# Patient Record
Sex: Male | Born: 1980 | Hispanic: No | Marital: Single | Smoking: Never smoker
Health system: Southern US, Community
[De-identification: ages and names within clinical notes are randomized; demographics above are authoritative.]

---

## 2000-02-08 ENCOUNTER — Emergency Department (HOSPITAL_COMMUNITY): Admission: EM | Admit: 2000-02-08 | Discharge: 2000-02-08 | Payer: Self-pay | Admitting: Emergency Medicine

## 2000-02-08 ENCOUNTER — Encounter: Payer: Self-pay | Admitting: Emergency Medicine

## 2000-05-22 ENCOUNTER — Emergency Department (HOSPITAL_COMMUNITY): Admission: EM | Admit: 2000-05-22 | Discharge: 2000-05-22 | Payer: Self-pay | Admitting: Emergency Medicine

## 2002-03-23 ENCOUNTER — Emergency Department (HOSPITAL_COMMUNITY): Admission: EM | Admit: 2002-03-23 | Discharge: 2002-03-24 | Payer: Self-pay | Admitting: Emergency Medicine

## 2002-03-24 ENCOUNTER — Emergency Department (HOSPITAL_COMMUNITY): Admission: EM | Admit: 2002-03-24 | Discharge: 2002-03-25 | Payer: Self-pay

## 2017-03-15 ENCOUNTER — Ambulatory Visit (INDEPENDENT_AMBULATORY_CARE_PROVIDER_SITE_OTHER): Payer: Self-pay | Admitting: Emergency Medicine

## 2017-03-15 VITALS — BP 134/74 | HR 88 | Temp 98.3°F | Resp 16 | Ht 68.0 in | Wt 263.4 lb

## 2017-03-15 DIAGNOSIS — F430 Acute stress reaction: Secondary | ICD-10-CM | POA: Insufficient documentation

## 2017-03-15 DIAGNOSIS — F439 Reaction to severe stress, unspecified: Secondary | ICD-10-CM

## 2017-03-15 DIAGNOSIS — H6123 Impacted cerumen, bilateral: Secondary | ICD-10-CM | POA: Insufficient documentation
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
## 2017-03-15 NOTE — Patient Instructions (Addendum)
Compre en la farmacia un kit para remover cerumen del oido (wax removal kit).    IF you received an x-ray today, you will receive an invoice from [facility]. Please contact [facility] at [phone] with questions or concerns regarding your invoice.   IF you received labwork today, you will receive an invoice from [place]. Please contact [facility] at [phone] with questions or concerns regarding your invoice.   Our billing staff will not be able to assist you with questions regarding bills from these companies.  You will be contacted with the lab results as soon as they are available. The fastest way to get your results is to activate your My Chart account. Instructions are located on the last page of this paperwork. If you have not heard from [place] regarding the results in 2 weeks, please contact this office.     Acumulacin de cera en el odo (Earwax Buildup) Los odos producen una sustancia llamada cera. Tambin se puede llamar cerumen. A veces, se acumula demasiada cera en el canal del odo. Esto puede causar dolor de odos y dificultar la audicin. CAUSAS Esta afeccin es causada por demasiada produccin o acumulacin de cera. [place] pueden hacer que usted sea propenso a sufrir esta afeccin:  Limpiarse con frecuencia las orejas con hisopos.  Tener los canales [employer] estrechos.  Tener cera que es demasiado densa o pegajosa.  Tener eccema.  Estar deshidratado. SNTOMAS Los sntomas de esta afeccin incluyen lo siguiente:  Disminucin de la audicin.  Secrecin del odo.  Dolor de odo.  Picazn en el odo.  Una sensacin de que el odo est lleno u obstruido.  Zumbidos en el odo.  Tos. DIAGNSTICO El mdico puede diagnosticar esta afeccin en funcin de los sntomas y la historia clnica. El mdico tambin har un examen del odo y Psychologist, counselling el interior del odo con un dispositivo llamado otoscopio. Tambin  pueden hacerle un estudio de audicin. TRATAMIENTO El tratamiento de esta afeccin incluye lo siguiente:  Gotas ticas de venta libre o recetadas para ablandar la cera.  Extraccin de cera por un mdico. Esto se puede realizar de las siguientes maneras:  Purgar el odo con agua a la temperatura del cuerpo.  Con un instrumento mdico que tiene un rulo en el extremo (cureta para la cera).  Con un dispositivo para aspirar. [place] los medicamentos de venta libre y los recetados solamente como se lo haya indicado el mdico.  No introduzca ningn objeto en su odo, ni siquiera hisopos. La abertura del canal auditivo se puede limpiar con un pao.  Beba suficiente agua para mantener la orina clara o de color amarillo plido.  Si tiene con frecuencia acumulacin de cera en el odo o [place] audfonos, concurra a su mdico cada 6 o 12 meses para una rutina preventiva de limpieza del odo. Concurra a todas las visitas de control como se lo haya indicado el mdico. SOLICITE ATENCIN MDICA SI:  Siente dolor de odos.  La afeccin no mejora con tratamiento.  Tiene prdida de la audicin.  Observa sangre, pus u otro lquido que salen del odo. Esta informacin no tiene Marine scientist el consejo del mdico. Asegrese de hacerle al mdico cualquier pregunta que tenga. Document Released: 10/10/2005 Document Revised: 02/01/2016 Elsevier Interactive Patient Education  2017 [employer].

## 2020-01-17 ENCOUNTER — Emergency Department: Payer: PRIVATE HEALTH INSURANCE

## 2020-01-17 ENCOUNTER — Encounter: Payer: Self-pay | Admitting: Emergency Medicine

## 2020-01-17 ENCOUNTER — Emergency Department
Admission: EM | Admit: 2020-01-17 | Discharge: 2020-01-17 | Disposition: A | Discharge status: Home / Self Care | Payer: PRIVATE HEALTH INSURANCE | Attending: Emergency Medicine | Admitting: Emergency Medicine

## 2020-01-17 ENCOUNTER — Other Ambulatory Visit: Payer: Self-pay

## 2020-01-17 DIAGNOSIS — S92151A Displaced avulsion fracture (chip fracture) of right talus, initial encounter for closed fracture: Secondary | ICD-10-CM | POA: Insufficient documentation

## 2020-01-17 DIAGNOSIS — W11XXXA Fall on and from ladder, initial encounter: Secondary | ICD-10-CM | POA: Diagnosis not present

## 2020-01-17 DIAGNOSIS — Y929 Unspecified place or not applicable: Secondary | ICD-10-CM | POA: Diagnosis not present

## 2020-01-17 DIAGNOSIS — S92011A Displaced fracture of body of right calcaneus, initial encounter for closed fracture: Secondary | ICD-10-CM

## 2020-01-17 DIAGNOSIS — Y939 Activity, unspecified: Secondary | ICD-10-CM | POA: Insufficient documentation

## 2020-01-17 DIAGNOSIS — Y990 Civilian activity done for income or pay: Secondary | ICD-10-CM | POA: Diagnosis not present

## 2020-01-17 DIAGNOSIS — S99921A Unspecified injury of right foot, initial encounter: Secondary | ICD-10-CM | POA: Diagnosis present

## 2020-01-17 MED ORDER — OXYCODONE HCL 5 MG PO TABS: 5.0000 mg | ORAL_TABLET | Freq: Four times a day (QID) | ORAL | 0 refills | Status: AC | PRN

## 2020-01-17 MED ORDER — OXYCODONE-ACETAMINOPHEN 5-325 MG PO TABS
2.0000 | ORAL_TABLET | Freq: Once | ORAL | Status: AC
  Administered 2020-01-17: 2 via ORAL
  Filled 2020-01-17: qty 2

## 2020-01-17 NOTE — ED Triage Notes (Signed)
Pt fell 10 ft off ladder landing on right leg/foot.  C/o pain to foot/ankle/calf.  Cannot bear weight on leg.  Swelling noted.  No obvious deformity.  No neck pain, back pain or head pain. No impact or injury except to foot.

## 2020-01-17 NOTE — ED Provider Notes (Signed)
[facility] EMERGENCY DEPARTMENT Provider Note   CSN: [id number] Arrival date & time: 01/17/20  1336     History Chief Complaint  Patient presents with  . Foot Injury    Jared Barry is a 39 y.o. male presents to the emergency department for evaluation of right foot pain.  He fell off of a ladder at work just prior to arrival, states he was approximately 10 feet high and landed on his right foot.  He has a little soreness in the neck and thoracic spine.  Denies hitting his head, losing consciousness or any nausea or vomiting.  Pain is severe throughout the foot and ankle.  He denies any calf pain, knee pain or hip pain on the right side.  No left-sided discomfort.  No numbness tingling radicular symptoms in the upper or lower extremities.  He does not take blood thinners.  HPI     History reviewed. No pertinent past medical history.  Patient Active Problem List   Diagnosis Date Noted  . Impacted cerumen of both ears 03/15/2017  . Acute stress reaction with predominately emotional disturbance 03/15/2017    History reviewed. No pertinent surgical history.     History reviewed. No pertinent family history.  Social History   Tobacco Use  . Smoking status: Never Smoker  . Smokeless tobacco: Never Used  Substance Use Topics  . Alcohol use: Yes  . Drug use: Never    Home Medications Prior to Admission medications   Medication Sig Start Date End Date Taking? Authorizing Provider  oxyCODONE (ROXICODONE) 5 MG immediate release tablet Take 1 tablet (5 mg total) by mouth every 6 (six) hours as needed. 01/17/20 01/16/21  Evon Slack, PA-C    Allergies    Patient has no known allergies.  Review of Systems   Review of Systems  Constitutional: Negative for fever.  Respiratory: Negative for shortness of breath.   Cardiovascular: Negative for chest pain.  Gastrointestinal: Negative for abdominal pain, nausea and vomiting.  Musculoskeletal:  Positive for arthralgias, gait problem, joint swelling and myalgias.  Skin: Negative for color change, rash and wound.  Neurological: Negative for dizziness, numbness and headaches.    Physical Exam Updated Vital Signs BP (!) 153/77 (BP Location: Right Arm)   Pulse 98   Temp 98.8 F (37.1 C) (Oral)   Resp 17   Ht 6' (1.829 m)   Wt 113.4 kg   SpO2 97%   BMI 33.91 kg/m   Physical Exam Constitutional:      Appearance: He is well-developed.  HENT:     Head: Normocephalic and atraumatic.  Eyes:     Conjunctiva/sclera: Conjunctivae normal.  Cardiovascular:     Rate and Rhythm: Normal rate.  Pulmonary:     Effort: Pulmonary effort is normal. No respiratory distress.     Breath sounds: Normal breath sounds. No wheezing.  Abdominal:     General: Abdomen is flat. Bowel sounds are normal. There is no distension.     Tenderness: There is no abdominal tenderness. There is no guarding.  Musculoskeletal:        General: Normal range of motion.     Cervical back: Normal range of motion and neck supple. No rigidity or tenderness.     Comments: Diffusely tender with swelling along the foot and ankle.  2+ dorsalis pedis pulse present.  Calf is soft and nontender.  Sensation is intact distally.  No skin breakdown noted.  Full range of motion of the cervical  and lumbar spine with no tenderness to palpation.  No tenderness along the spinous process.  No paravertebral muscle tenderness.  Full range of motion of the upper extremities with normal strength.  No pain with logrolling lower extremities.  Knees are nontender to palpation.  Nontender throughout bilateral lower legs from the knee down to just above the ankles  Skin:    General: Skin is warm.     Findings: No rash.  Neurological:     General: No focal deficit present.     Mental Status: He is alert and oriented to person, place, and time. Mental status is at baseline.     Cranial Nerves: No cranial nerve deficit.     Sensory: No sensory  deficit.     Motor: No weakness.     Coordination: Coordination normal.  Psychiatric:        Mood and Affect: Mood normal.        Behavior: Behavior normal.        Thought Content: Thought content normal.        Judgment: Judgment normal.     ED Results / Procedures / Treatments   Labs (all labs ordered are listed, but only abnormal results are displayed) Labs Reviewed - No data to display  EKG None  Radiology DG Cervical Spine 2-3 Views  Result Date: 01/17/2020 CLINICAL DATA:  Status post fall. EXAM: CERVICAL SPINE - 2-3 VIEW COMPARISON:  None. FINDINGS: There is no evidence of cervical spine fracture or prevertebral soft tissue swelling. Alignment is normal. No other significant bone abnormalities are identified. IMPRESSION: Negative cervical spine radiographs. Electronically Signed   By: Aram Candela M.D.   On: 01/17/2020 17:54   DG Thoracic Spine 2 View  Result Date: 01/17/2020 CLINICAL DATA:  Status post fall. EXAM: THORACIC SPINE 2 VIEWS COMPARISON:  None. FINDINGS: There is no evidence of an acute thoracic spine fracture. Alignment is normal. Very mild multilevel endplate sclerosis is seen within the mid and lower thoracic spine. Normal multilevel intervertebral disc space narrowing is seen. IMPRESSION: No evidence of an acute thoracic spine fracture. Electronically Signed   By: Aram Candela M.D.   On: 01/17/2020 17:52   DG Lumbar Spine 2-3 Views  Result Date: 01/17/2020 CLINICAL DATA:  Status post fall. EXAM: LUMBAR SPINE - 2-3 VIEW COMPARISON:  None. FINDINGS: There is no evidence of lumbar spine fracture. Very mild levoscoliosis is seen. Intervertebral disc spaces are maintained. IMPRESSION: No acute fracture or subluxation. Electronically Signed   By: Aram Candela M.D.   On: 01/17/2020 17:54   DG Ankle Complete Right  Result Date: 01/17/2020 CLINICAL DATA:  Fall from ladder.  Right ankle pain. EXAM: RIGHT ANKLE - COMPLETE 3+ VIEW COMPARISON:  No recent.  FINDINGS: Diffuse soft tissue swelling. Tiny bony densities noted adjacent to the lateral aspect of the talus. These could represent tiny avulsion fractures. Comminuted fracture of the calcaneus appears to be present. CT may prove useful for further evaluation. Given the type of injury of thoracolumbar spine series may also prove useful to exclude vertebral body fracture. IMPRESSION: 1. Diffuse soft tissue swelling tiny bony densities noted adjacent to the lateral aspect of the talus. These could represent tiny avulsion fractures. 2. Comminuted fracture of the calcaneus appears to be present. CT may prove useful for further evaluation. Given the injury mechanism a thoracolumbar spine series may also prove useful to exclude vertebral body fracture. Electronically Signed   By: Maisie Fus  Register   On: 01/17/2020 15:36  CT Foot Right Wo Contrast  Result Date: 01/17/2020 CLINICAL DATA:  Calcaneal fracture EXAM: CT OF THE RIGHT FOOT WITHOUT CONTRAST TECHNIQUE: Multidetector CT imaging of the right foot was performed according to the standard protocol. Multiplanar CT image reconstructions were also generated. COMPARISON:  Same-day x-ray FINDINGS: Bones/Joint/Cartilage Acute heavily comminuted fracture of the calcaneal body with intra-articular extension to the subtalar joints. Nondisplaced fracture component at the anterior process which extends into the calcaneocuboid joint. Subtalar and calcaneocuboid joint alignment is maintained without dislocation. Numerous small comminuted fracture fragments within the sinus tarsi. Tibiotalar joint alignment is maintained without dislocation. Acute minimally displaced avulsion fractures from the lateral cortex of the talus (series 5, image 65). Probable nondisplaced fracture along the inferior tip of the lateral malleolus (series 7, image 219). Talar dome appears intact. Distal tibia is intact. Bones of the midfoot are intact without fracture or malalignment. Tarsometatarsal  joint alignment is anatomic. The MTP joints and phalanges of the right foot are intact. Ligaments Suboptimally assessed by CT. Muscles and Tendons Peroneal tendons are laterally dislocated. Posteromedial calcaneal fracture component closely approximates the traversing flexor hallucis longus tendon (series 6, image 82). Achilles tendon grossly intact. Anterior ankle tendons grossly intact. Unremarkable muscle bulk. Soft tissues Marked soft tissue swelling at the hindfoot and lateral ankle. Focal soft tissue hematoma at the medial aspect of the hindfoot measuring approximately 3.6 x 1.5 x 2.8 cm (series 6, image 89). Additional smaller more ill-defined hematoma lateral to the lateral malleolus. IMPRESSION: 1. Acute heavily comminuted fracture of the calcaneal body with intra-articular extension to the subtalar and calcaneocuboid joints. 2. Acute minimally displaced avulsion fractures from the lateral cortex of the talus. 3. Probable nondisplaced fracture along the inferior tip of the lateral malleolus. 4. Peroneal tendons are laterally dislocated. 5. Posteromedial calcaneal fracture component closely approximates the traversing flexor hallucis longus tendon. 6. Marked soft tissue swelling including a focal soft tissue hematoma at the medial aspect of the hindfoot measuring up to 3.6 cm. Electronically Signed   By: Davina Poke D.O.   On: 01/17/2020 17:06    Procedures .Ortho Injury Treatment  Date/Time: 01/17/2020 6:29 PM Performed by: Duanne Guess, PA-C Authorized by: Duanne Guess, PA-C   Consent:    Consent obtained:  Verbal   Consent given by:  Patient   Alternatives discussed:  No treatmentInjury location: foot Location details: right foot Injury type: fracture Fracture type: talar and calcaneal Pre-procedure neurovascular assessment: neurovascularly intact Pre-procedure distal perfusion: normal Pre-procedure neurological function: normal Pre-procedure range of motion:  reduced  Anesthesia: Local anesthesia used: no  Patient sedated: NoImmobilization: splint Supplies used: cotton padding,  elastic bandage and Ortho-Glass Post-procedure neurovascular assessment: post-procedure neurovascularly intact Post-procedure distal perfusion: normal Post-procedure neurological function: normal Post-procedure range of motion: unchanged    (including critical care time)  Medications Ordered in ED Medications  oxyCODONE-acetaminophen (PERCOCET/ROXICET) 5-325 MG per tablet 2 tablet (2 tablets Oral Given 01/17/20 1821)    ED Course  I have reviewed the triage vital signs and the nursing notes.  Pertinent labs & imaging results that were available during my care of the patient were reviewed by me and considered in my medical decision making (see chart for details).    MDM Rules/Calculators/A&P                      39 year old male with multiple fractures to the right foot and ankle.  Most severely comminuted calcaneal fracture.  Discussed with podiatry, place patient into  splint.  He is nonweightbearing given crutches.  He is given prescription for oxycodone for pain.  Will take over-the-counter Tylenol and ibuprofen as needed.  Will call podiatrist Monday morning to schedule follow-up appointment. Final Clinical Impression(s) / ED Diagnoses Final diagnoses:  Closed displaced fracture of body of right calcaneus, initial encounter  Closed displaced avulsion fracture of right talus, initial encounter    Rx / DC Orders ED Discharge Orders         Ordered    oxyCODONE (ROXICODONE) 5 MG immediate release tablet  Every 6 hours PRN     01/17/20 1822           Ronnette Juniper 01/17/20 1832    Emily Filbert, MD 01/18/20 1553

## 2020-01-17 NOTE — Discharge Instructions (Signed)
Please rest and elevate the right upper extremity.  Do not apply any weight on the right lower extremity.  Use crutches as needed for walking.  Take oxycodone as needed for pain.  Call podiatry office Monday to schedule follow-up appointment.

## 2021-05-26 ENCOUNTER — Other Ambulatory Visit: Payer: Self-pay

## 2021-05-26 ENCOUNTER — Emergency Department (HOSPITAL_COMMUNITY): Payer: Self-pay

## 2021-05-26 ENCOUNTER — Emergency Department (HOSPITAL_COMMUNITY)
Admission: EM | Admit: 2021-05-26 | Discharge: 2021-05-26 | Disposition: A | Discharge status: Home / Self Care | Payer: Self-pay | Attending: Emergency Medicine | Admitting: Emergency Medicine

## 2021-05-26 DIAGNOSIS — M79602 Pain in left arm: Secondary | ICD-10-CM | POA: Insufficient documentation

## 2021-05-26 DIAGNOSIS — R7401 Elevation of levels of liver transaminase levels: Secondary | ICD-10-CM

## 2021-05-26 DIAGNOSIS — I10 Essential (primary) hypertension: Secondary | ICD-10-CM

## 2021-05-26 DIAGNOSIS — M79609 Pain in unspecified limb: Secondary | ICD-10-CM

## 2021-05-26 DIAGNOSIS — R202 Paresthesia of skin: Secondary | ICD-10-CM | POA: Insufficient documentation

## 2021-05-26 LAB — COMPREHENSIVE METABOLIC PANEL
ALT: 59 U/L — ABNORMAL HIGH (ref 0–44)
AST: 31 U/L (ref 15–41)
Albumin: 4.4 g/dL (ref 3.5–5.0)
Alkaline Phosphatase: 57 U/L (ref 38–126)
Anion gap: 11 (ref 5–15)
BUN: 11 mg/dL (ref 6–20)
CO2: 23 mmol/L (ref 22–32)
Calcium: 9.5 mg/dL (ref 8.9–10.3)
Chloride: 103 mmol/L (ref 98–111)
Creatinine, Ser: 0.78 mg/dL (ref 0.61–1.24)
GFR, Estimated: >60 mL/min (ref >60)
Glucose, Bld: 136 mg/dL — ABNORMAL HIGH (ref 70–99)
Potassium: 3.7 mmol/L (ref 3.5–5.1)
Sodium: 137 mmol/L (ref 135–145)
Total Bilirubin: 1 mg/dL (ref 0.3–1.2)
Total Protein: 7.7 g/dL (ref 6.5–8.1)

## 2021-05-26 LAB — URINALYSIS, ROUTINE W REFLEX MICROSCOPIC
Bilirubin Urine: NEGATIVE
Glucose, UA: NEGATIVE mg/dL
Hgb urine dipstick: NEGATIVE
Ketones, ur: NEGATIVE mg/dL
Leukocytes,Ua: NEGATIVE
Nitrite: NEGATIVE
Protein, ur: NEGATIVE mg/dL
Specific Gravity, Urine: 1.001 — ABNORMAL LOW (ref 1.005–1.030)
pH: 6 (ref 5.0–8.0)

## 2021-05-26 LAB — CBC WITH DIFFERENTIAL/PLATELET
Abs Immature Granulocytes: 0.02 10*3/uL (ref 0.00–0.07)
Basophils Absolute: 0 10*3/uL (ref 0.0–0.1)
Basophils Relative: 1 %
Eosinophils Absolute: 0.2 10*3/uL (ref 0.0–0.5)
Eosinophils Relative: 3 %
HCT: 42.1 % (ref 39.0–52.0)
Hemoglobin: 14.5 g/dL (ref 13.0–17.0)
Immature Granulocytes: 0 %
Lymphocytes Relative: 38 %
Lymphs Abs: 2.4 10*3/uL (ref 0.7–4.0)
MCH: 31.9 pg (ref 26.0–34.0)
MCHC: 34.4 g/dL (ref 30.0–36.0)
MCV: 92.7 fL (ref 80.0–100.0)
Monocytes Absolute: 0.4 10*3/uL (ref 0.1–1.0)
Monocytes Relative: 7 %
Neutro Abs: 3.2 10*3/uL (ref 1.7–7.7)
Neutrophils Relative %: 51 %
Platelets: 307 10*3/uL (ref 150–400)
RBC: 4.54 MIL/uL (ref 4.22–5.81)
RDW: 12.2 % (ref 11.5–15.5)
WBC: 6.3 10*3/uL (ref 4.0–10.5)
nRBC: 0 % (ref 0.0–0.2)

## 2021-05-26 LAB — TROPONIN I (HIGH SENSITIVITY)
Troponin I (High Sensitivity): 5 ng/L (ref <18)
Troponin I (High Sensitivity): 4 ng/L (ref <18)

## 2021-05-26 LAB — BETA-HYDROXYBUTYRIC ACID: Beta-Hydroxybutyric Acid: 0.36 mmol/L — ABNORMAL HIGH (ref 0.05–0.27)

## 2021-05-26 NOTE — ED Triage Notes (Signed)
Pt states having left sided arm numbness, mouth is dry, and feeling malaise since Monday. Was seen at a clinic and given lisinopril and metformin yesterday. Symptoms returned today around midnight. Denies Headache. No other neuro symptoms.

## 2021-05-26 NOTE — ED Provider Notes (Signed)
[facility] EMERGENCY DEPARTMENT Provider Note   CSN: [id number] Arrival date & time: 05/26/21  0235     History Chief Complaint  Patient presents with   Hypertension   Numbness    Right Arm     Jared Barry is a 40 y.o. male.  The history is provided by the patient. A language interpreter was used.  Hypertension He was seen at a clinic yesterday and told that his blood sugar was high and his blood pressure was high.  He was given prescriptions for metformin and lisinopril and has taken the first dose of each.  Tonight, he noted numbness in his left arm.  He denies any facial numbness or leg numbness.  He denies any weakness.  He is concerned that his blood pressure may have gone up and came in for further evaluation.  He denies chest pain, heaviness, tightness, pressure.  He denies headache, tinnitus, nosebleed.  He denies dyspnea.  He denies any weakness.   No past medical history on file.  Patient Active Problem List   Diagnosis Date Noted   Impacted cerumen of both ears 03/15/2017   Acute stress reaction with predominately emotional disturbance 03/15/2017    No past surgical history on file.     No family history on file.  Social History   Tobacco Use   Smoking status: Never   Smokeless tobacco: Never  Substance Use Topics   Alcohol use: Yes   Drug use: Never    Home Medications Prior to Admission medications   Not on File    Allergies    Patient has no known allergies.  Review of Systems   Review of Systems  All other systems reviewed and are negative.  Physical Exam Updated Vital Signs BP (!) 157/98 (BP Location: Right Arm)   Pulse 78   Temp 98.2 F (36.8 C) (Oral)   Resp 20   SpO2 99%   Physical Exam Vitals and nursing note reviewed.  40 year old male, resting comfortably and in no acute distress. Vital signs are significant for elevated blood pressure. Oxygen saturation is 99%, which is normal. Head is normocephalic  and atraumatic. PERRLA, EOMI. Oropharynx is clear. Neck is nontender and supple without adenopathy or JVD.  There are no carotid bruits. Back is nontender and there is no CVA tenderness. Lungs are clear without rales, wheezes, or rhonchi. Chest is nontender. Heart has regular rate and rhythm without murmur. Abdomen is soft, flat, nontender without masses or hepatosplenomegaly and peristalsis is normoactive. Extremities have no cyanosis or edema, full range of motion is present. Skin is warm and dry without rash. Neurologic: Mental status is normal, cranial nerves are intact.  Strength is 5/5 in all 4 extremities.  There is no pronator drift sensation is equal throughout.  There is no extinction on double simultaneous stimulation.  Station and gait are normal.  ED Results / Procedures / Treatments   Labs (all labs ordered are listed, but only abnormal results are displayed) Labs Reviewed  COMPREHENSIVE METABOLIC PANEL - Abnormal; Notable for the following components:      Result Value   Glucose, Bld 136 (*)    ALT 59 (*)    All other components within normal limits  BETA-HYDROXYBUTYRIC ACID - Abnormal; Notable for the following components:   Beta-Hydroxybutyric Acid 0.36 (*)    All other components within normal limits  URINALYSIS, ROUTINE W REFLEX MICROSCOPIC - Abnormal; Notable for the following components:   Color, Urine COLORLESS (*)  Specific Gravity, Urine 1.001 (*)    All other components within normal limits  CBC WITH DIFFERENTIAL/PLATELET  CBG MONITORING, ED  TROPONIN I (HIGH SENSITIVITY)  TROPONIN I (HIGH SENSITIVITY)    EKG EKG Interpretation  Date/Time:  Wednesday May 26 2021 02:59:05 EDT Ventricular Rate:  75 PR Interval:  144 QRS Duration: 90 QT Interval:  386 QTC Calculation: 431 R Axis:   84 Text Interpretation: Normal sinus rhythm Possible Inferior infarct , age undetermined Abnormal ECG No old tracing to compare Confirmed by Dione Booze ([id number]) on  05/26/2021 5:32:28 AM  Radiology DG Chest 2 View  Result Date: 05/26/2021 CLINICAL DATA:  Hypertension, chest pain, left arm numbness EXAM: CHEST - 2 VIEW COMPARISON:  None. FINDINGS: The heart size and mediastinal contours are within normal limits. Both lungs are clear. The visualized skeletal structures are unremarkable. IMPRESSION: No active cardiopulmonary disease. Electronically Signed   By: Helyn Numbers MD   On: 05/26/2021 03:28   CT HEAD WO CONTRAST ( )  Result Date: 05/26/2021 CLINICAL DATA:  Left arm numbness EXAM: CT HEAD WITHOUT CONTRAST TECHNIQUE: Contiguous axial images were obtained from the base of the skull through the vertex without intravenous contrast. COMPARISON:  None. FINDINGS: Brain: There is no mass, hemorrhage or extra-axial collection. Asymmetry of the lateral ventricles with slightly enlarged right side. This may indicate the presence of an intraventricular arachnoid cyst. The brain parenchyma is normal, without acute or chronic infarction. 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 Vascular: No abnormal hyperdensity of the major intracranial arteries or dural venous sinuses. No intracranial atherosclerosis. Skull: The visualized skull base, calvarium and extracranial soft tissues are normal. Sinuses/Orbits: No fluid levels or advanced mucosal thickening of the visualized paranasal sinuses. No mastoid or middle ear effusion. The orbits are normal. Electronically Signed   By: Deatra Robinson M.D.   On: 05/26/2021 04:10    Procedures Procedures   Medications Ordered in ED Medications - No data to display  ED Course  I have reviewed the triage vital signs and the nursing notes.  Pertinent labs & imaging results that were available during my care of the patient were reviewed by me and considered in my medical decision making (see chart for details).    MDM Rules/Calculators/A&P                         Elevated blood pressure with recent diagnosis of hypertension.   Paresthesias without any objective sensory loss.  No evidence of stroke.  From triage, he was sent for CT of head which was showed no acute findings.  Chest x-ray was normal and ECG showed inferior wall Q waves with which could be consistent with an old inferior wall myocardial infarction.  Labs show mildly elevated glucose of 136, and mild elevation of ALT which is not felt to be clinically significant.  Old records are reviewed, and he has no relevant past visits.  Patient was reassured that there is no sign of stroke today.  He is encouraged to continue taking his metformin and lisinopril as prescribed.  He is encouraged to monitor his blood pressure at home.  He has a follow-up appointment in this clinic later today needs to keep that appointment.  Final Clinical Impression(s) / ED Diagnoses Final diagnoses:  Paresthesia and pain of left extremity  Elevated blood pressure reading with diagnosis of hypertension  Elevated ALT measurement    Rx / DC Orders ED Discharge Orders  None        Dione Booze, MD 05/26/21 [phone]

## 2021-05-26 NOTE — Discharge Instructions (Addendum)
Contine tomando metformina y lisinopril.  Consiga una mquina para controlar su presin arterial en casa. Lleve un registro de ello y llvelo consigo cuando vaya a la clnica.

## 2021-05-26 NOTE — ED Provider Notes (Signed)
Emergency Medicine Provider Triage Evaluation Note  Jared Barry , a 40 y.o. male  was evaluated in triage.  Pt complains of HTN.  Pt reports yesterday he went to a urgent care due to left sided arm numbness that started on Monday.  Pt reports he was found to have HTN and was given metformin and lisinopril, but today the blood pressure came back up. He reports has not been checking this at home. He reports tonight he became concerned because the arm numbness returned around midnight. He was also concerned about his blood sugar.  Pt reports his mouth and eyes feel dry and that he is urinating often.  Review of Systems  Positive: HTN, left arm paresthesias Negative: Headache, neck pain  Physical Exam  BP (!) 157/98 (BP Location: Right Arm)   Pulse 78   Temp 98.2 F (36.8 C) (Oral)   Resp 20   SpO2 99%  Gen:   Awake, no distress   Resp:  Normal effort  MSK:   Moves extremities without difficulty  Other:  Strength 5/5 in the BUE and BLE, normal gait, dry mucous membranes   Medical Decision Making  Medically screening exam initiated at 2:50 AM.  Appropriate orders placed.  Jared Barry was informed that the remainder of the evaluation will be completed by another provider, this initial triage assessment does not replace that evaluation, and the importance of remaining in the ED until their evaluation is complete.  Suspect hypertensive urgency +/- hyperglycemia. Work-up pending.    Jared Barry, Jared Barry 05/26/21 0258    Jared Booze, MD 05/26/21 Jared Barry

## 2022-02-08 ENCOUNTER — Ambulatory Visit
Admission: RE | Admit: 2022-02-08 | Discharge: 2022-02-08 | Disposition: A | Discharge status: Home / Self Care | Payer: Self-pay | Source: Ambulatory Visit | Attending: Emergency Medicine | Admitting: Emergency Medicine

## 2022-02-08 VITALS — BP 124/80 | HR 86 | Temp 98.0°F | Resp 20

## 2022-02-08 DIAGNOSIS — K649 Unspecified hemorrhoids: Secondary | ICD-10-CM | POA: Insufficient documentation

## 2022-02-08 DIAGNOSIS — J038 Acute tonsillitis due to other specified organisms: Secondary | ICD-10-CM | POA: Insufficient documentation

## 2022-02-08 DIAGNOSIS — B9689 Other specified bacterial agents as the cause of diseases classified elsewhere: Secondary | ICD-10-CM | POA: Insufficient documentation

## 2022-02-08 DIAGNOSIS — J020 Streptococcal pharyngitis: Secondary | ICD-10-CM | POA: Insufficient documentation

## 2022-02-08 MED ORDER — HYDROCORTISONE (PERIANAL) 2.5 % EX CREA: 1.0000 "application " | TOPICAL_CREAM | Freq: Two times a day (BID) | CUTANEOUS | 1 refills | Status: AC

## 2022-02-08 MED ORDER — CEFDINIR 300 MG PO CAPS: 300.0000 mg | ORAL_CAPSULE | Freq: Two times a day (BID) | ORAL | 0 refills | Status: AC

## 2022-02-08 NOTE — ED Triage Notes (Signed)
Pt presents with possible abscess or hemorrhoid around anus , discovered yesterday, also has c/o sore throat   ?

## 2022-02-08 NOTE — Discharge Instructions (Addendum)
Su prueba r?pida de estreptococo de hoy es negativa; sin embargo, seg?n los [employer] del examen f?sico, creo que tiene una infecci?n bacteriana que no es [employer]. He enviado una receta de antibi?ticos para tratar la infecci?n a su farmacia. Tome 1 c?psula dos veces al d?a durante los pr?ximos 10 d?as. Tome todas las c?psulas seg?n lo prescrito, no se salte ninguna dosis. El tratamiento incompleto puede provocar un empeoramiento de la infecci?n que puede requerir hospitalizaci?n. Reemplace su cepillo de dientes con un cepillo de dientes nuevo para evitar una reinfecci?n. ? ?Lea la informaci?n adjunta sobre las hemorroides. ? ?Por favor, comience la crema de proctozona. Aplique la crema en el ?rea afectada dos veces al d?a y nuevamente cada vez que defeque. Si el ?rea no ha mejorado despu?s de 2 semanas de tratamiento, es posible que deba ver a un cirujano general para que le extraiga la hemorroide. ? ?Envi? una solicitud a nuestro departamento de referencias para ayudarlo a Clinical research associate un proveedor de atenci?n primaria. Las hemorroides se consideran un problema de atenci?n primaria porque no ponen en peligro la vida y no requieren tratamiento urgente. Cuando tenga inquietudes que no sean urgentes, aseg?rese de comunicarse con su proveedor de atenci?n primaria. ? ?Gracias por visitar la atenci?n de urgencia hoy. ? ? ? ?Your rapid strep test today is negative however based on physical exam findings I believe that you have a bacterial infection that is not strep.  I have sent a prescription for antibiotics to treat the infection to your pharmacy.  Please take 1 capsule twice daily for the next 10 days.   Please take all capsules as prescribed, do not skip doses.  Incomplete treatment can result in worsening infection that may require hospitalization.  Please replace your toothbrush with a brand-new toothbrush to prevent reinfection.  ? ?Please read the enclosed information about hemorrhoids. ? ?Please begin  proctozone cream.  Please apply the cream to the affected area twice daily and again every time you move your bowels.  If the area has not improved after 2 weeks of treatment, you may need to see a general surgeon to have the hemorrhoid removed. ? ?I have sent a request to our referral department to help you find a primary care provider.  Hemorrhoids are considered a primary care issue because they are not life-threatening and do not require urgent treatment.  When you have concerns that are not urgent, please be sure you reach out to your primary care provider. ? ?Thank you for visiting urgent care today. ?

## 2022-02-08 NOTE — ED Provider Notes (Signed)
?[facility] ? ? ? ?CSN: [id number] ?Arrival date & time: 02/08/22  1230 ?  ? ?HISTORY  ? ?Chief Complaint  ?Patient presents with  ? Wound Check  ?  Not necessarily a wound, but options for visits are limited. I am having pain in my anal area, like inflamation of some sort, need help  please - Entered by patient  ? ?HPI ?Jared Barry is a 41 y.o. male. Patient complains of a possible abscess or hemorrhoid around his anus, states he noticed it yesterday.  Patient has not tried any medication to relieve his symptoms.  Patient also complains of a sore throat.  Patient has normal vital signs on arrival today.  Has also not tried any medications to relieve sore throat. ? ?The history is provided by the patient.  ?History reviewed. No pertinent past medical history. ?Patient Active Problem List  ? Diagnosis Date Noted  ? Impacted cerumen of both ears 03/15/2017  ? Acute stress reaction with predominately emotional disturbance 03/15/2017  ? ?History reviewed. No pertinent surgical history. ? ?Home Medications   ? ?Prior to Admission medications   ?Not on File  ? ? ?Family History ?History reviewed. No pertinent family history. ?Social History ?Social History  ? ?Tobacco Use  ? Smoking status: Never  ? Smokeless tobacco: Never  ?Substance Use Topics  ? Alcohol use: Yes  ? Drug use: Never  ? ?Allergies   ?Patient has no known allergies. ? ?Review of Systems ?Review of Systems ?Pertinent findings noted in history of present illness.  ? ?Physical Exam ?Triage Vital Signs ?ED Triage Vitals  ?Enc Vitals Group  ?   BP 08/20/21 0827 (!) 147/82  ?   Pulse Rate 08/20/21 0827 72  ?   Resp 08/20/21 0827 18  ?   Temp 08/20/21 0827 98.3 ?F (36.8 ?C)  ?   Temp Source 08/20/21 0827 Oral  ?   SpO2 08/20/21 0827 98 %  ?   Weight --   ?   Height --   ?   Head Circumference --   ?   Peak Flow --   ?   Pain Score 08/20/21 0826 5  ?   Pain Loc --   ?   Pain Edu? --   ?   Excl. in GC? --   ?No data found. ? ?Updated Vital  Signs ?BP 124/80   Pulse 86   Temp 98 ?F (36.7 ?C)   Resp 20   SpO2 98%  ? ?Physical Exam ?Constitutional:   ?   General: He is not in acute distress. ?   Appearance: He is well-developed. He is ill-appearing. He is not toxic-appearing.  ?HENT:  ?   Head: Normocephalic and atraumatic.  ?   Salivary Glands: Right salivary gland is diffusely enlarged and tender. Left salivary gland is diffusely enlarged and tender.  ?   Right Ear: Hearing and external ear normal.  ?   Left Ear: Hearing and external ear normal.  ?   Ears:  ?   Comments: Bilateral EACs with mild erythema, bilateral TMs are normal ?   Nose: No mucosal edema, congestion or rhinorrhea.  ?   Right Turbinates: Not enlarged, swollen or pale.  ?   Left Turbinates: Not enlarged or swollen.  ?   Right Sinus: No maxillary sinus tenderness or frontal sinus tenderness.  ?   Left Sinus: No maxillary sinus tenderness or frontal sinus tenderness.  ?   Mouth/Throat:  ?  Lips: Pink. No lesions.  ?   Mouth: Mucous membranes are moist. No oral lesions or angioedema.  ?   Dentition: No gingival swelling.  ?   Tongue: No lesions.  ?   Palate: No mass.  ?   Pharynx: Uvula midline. Pharyngeal swelling, oropharyngeal exudate and posterior oropharyngeal erythema present. No uvula swelling.  ?   Tonsils: Tonsillar exudate present. 2+ on the right. 2+ on the left.  ?Eyes:  ?   Extraocular Movements: Extraocular movements intact.  ?   Conjunctiva/sclera: Conjunctivae normal.  ?   Pupils: Pupils are equal, round, and reactive to light.  ?Neck:  ?   Thyroid: No thyroid mass, thyromegaly or thyroid tenderness.  ?   Trachea: Tracheal tenderness present. No abnormal tracheal secretions or tracheal deviation.  ?   Comments: Voice is muffled ?Cardiovascular:  ?   Rate and Rhythm: Normal rate and regular rhythm.  ?   Pulses: Normal pulses.  ?   Heart sounds: Normal heart sounds, S1 normal and S2 normal. No murmur heard. ?  No friction rub. No gallop.  ?Pulmonary:  ?   Effort:  Pulmonary effort is normal. No accessory muscle usage, prolonged expiration, respiratory distress or retractions.  ?   Breath sounds: No stridor, decreased air movement or transmitted upper airway sounds. No decreased breath sounds, wheezing, rhonchi or rales.  ?Abdominal:  ?   General: Bowel sounds are normal.  ?   Palpations: Abdomen is soft.  ?   Tenderness: There is generalized abdominal tenderness. There is no right CVA tenderness, left CVA tenderness or rebound. Negative signs include Murphy's sign.  ?   Hernia: No hernia is present.  ?Genitourinary: ?   Rectum: External hemorrhoid present.  ? ? ?Musculoskeletal:     ?   General: No tenderness. Normal range of motion.  ?   Cervical back: Full passive range of motion without pain, normal range of motion and neck supple.  ?   Right lower leg: No edema.  ?   Left lower leg: No edema.  ?Lymphadenopathy:  ?   Cervical: Cervical adenopathy present.  ?   Right cervical: Superficial cervical adenopathy present.  ?   Left cervical: Superficial cervical adenopathy present.  ?Skin: ?   General: Skin is warm and dry.  ?   Findings: No erythema, lesion or rash.  ?Neurological:  ?   General: No focal deficit present.  ?   Mental Status: He is alert and oriented to person, place, and time. Mental status is at baseline.  ?Psychiatric:     ?   Mood and Affect: Mood normal.     ?   Behavior: Behavior normal.     ?   Thought Content: Thought content normal.     ?   Judgment: Judgment normal.  ? ? ?Visual Acuity ?Right Eye Distance:   ?Left Eye Distance:   ?Bilateral Distance:   ? ?Right Eye Near:   ?Left Eye Near:    ?Bilateral Near:    ? ?UC Couse / Diagnostics / Procedures:  ?  ?EKG ? ?Radiology ?No results found. ? ?Procedures ?Procedures (including critical care time) ? ?UC Diagnoses / Final Clinical Impressions(s)   ?I have reviewed the triage vital signs and the nursing notes. ? ?Pertinent labs & imaging results that were available during my care of the patient were  reviewed by me and considered in my medical decision making (see chart for details).   ? ?Final diagnoses:  ?Hemorrhoids, unspecified hemorrhoid  type  ?Acute bacterial tonsillitis  ? ?Rapid strep test is negative, throat culture pending.  Patient advised to begin cefdinir given significant tonsillar exudate and enlarged tonsils on exam. ? ?Patient provided with prescription for prednisone and advised to follow-up with general surgery if he has not had improved symptoms after 2 weeks of treatment.  Patient also advised that we will assist him in finding a primary care provider for his nonurgent medical issues. ?ED Prescriptions   ? ? Medication Sig Dispense Auth. Provider  ? hydrocortisone (PROCTOZONE-HC) 2.5 % rectal cream Place 1 application. rectally 2 (two) times daily. And after every bowel movement. 30 g Theadora Rama Scales, PA-C  ? cefdinir (OMNICEF) 300 MG capsule Take 1 capsule (300 mg total) by mouth 2 (two) times daily for 10 days. 20 capsule Theadora Rama Scales, PA-C  ? ?  ? ?PDMP not reviewed this encounter. ? ?Pending results:  ?Labs Reviewed  ?AEROBIC CULTURE W GRAM STAIN (SUPERFICIAL SPECIMEN)  ?POCT RAPID STREP A (OFFICE)  ? ? ?Medications Ordered in UC: ?Medications - No data to display ? ?Disposition Upon Discharge:  ?Condition: stable for discharge home ?Home: take medications as prescribed; routine discharge instructions as discussed; follow up as advised. ? ?Patient presented with an acute illness with associated systemic symptoms and significant discomfort requiring urgent management. In my opinion, this is a condition that a prudent lay person (someone who possesses an average knowledge of health and medicine) may potentially expect to result in complications if not addressed urgently such as respiratory distress, impairment of bodily function or dysfunction of bodily organs.  ? ?Routine symptom specific, illness specific and/or disease specific instructions were discussed with the  patient and/or caregiver at length.  ? ?As such, the patient has been evaluated and assessed, work-up was performed and treatment was provided in alignment with urgent care protocols and evidence based medicine.  Patient/parent

## 2022-02-08 NOTE — ED Triage Notes (Signed)
Friend is translating for pt  ?

## 2022-02-11 LAB — AEROBIC CULTURE W GRAM STAIN (SUPERFICIAL SPECIMEN): Culture: NORMAL

## 2023-03-09 IMAGING — CT CT HEAD W/O CM
4 series · 16 of 47 positions shown, 18 images · non-contrast
Comparison: None.

CLINICAL DATA: Left arm numbness

EXAM:
CT HEAD WITHOUT CONTRAST
TECHNIQUE: Contiguous axial images were obtained from the base of the skull
through the vertex without intravenous contrast.

[Series 3: head wo · axial · 0.40mm/px · z∈[-86,+34]mm · 7 of 34 slices shown, 9 images]
[im 5/34  brain]
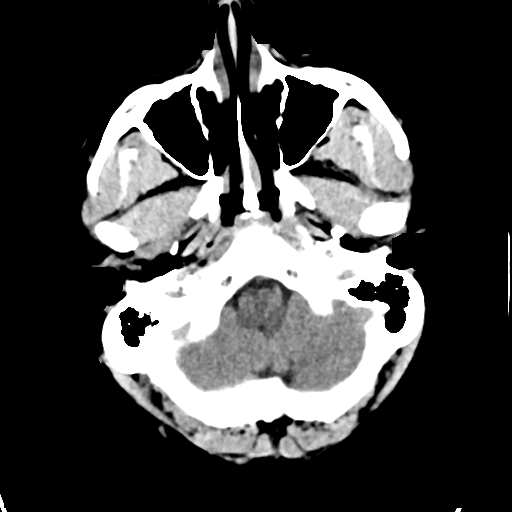
[im 5/34  bone]
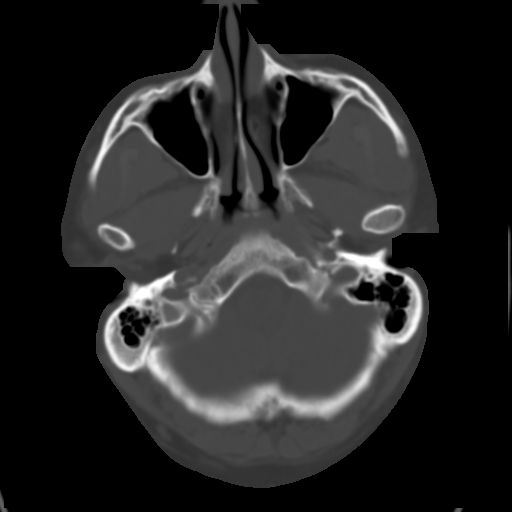
[im 9/34  brain]
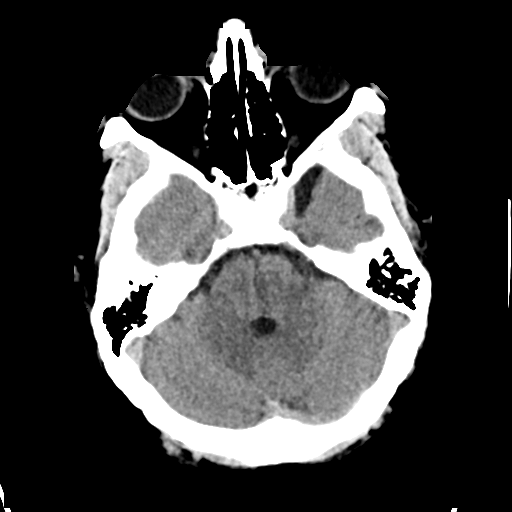
[im 13/34  brain]
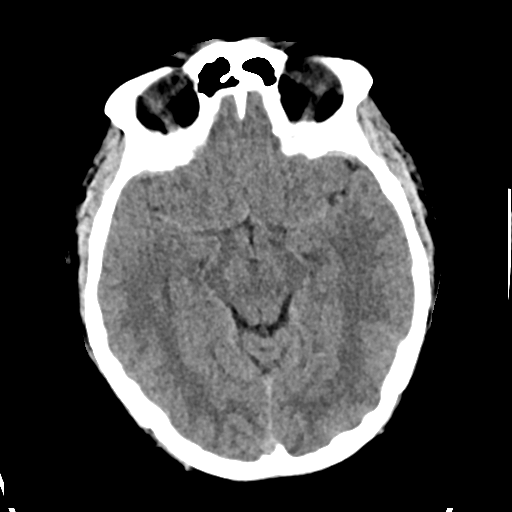
[im 17/34  brain]
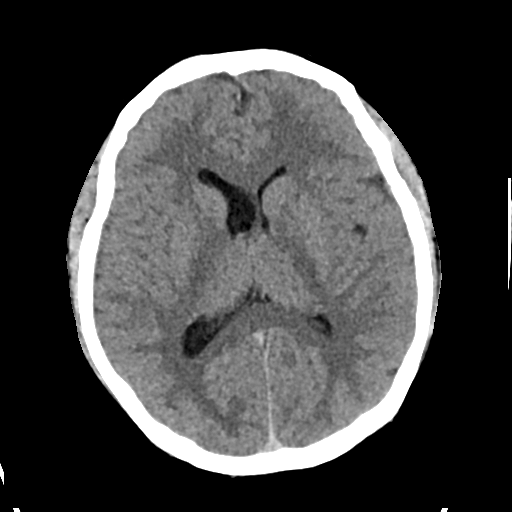
[im 21/34  brain]
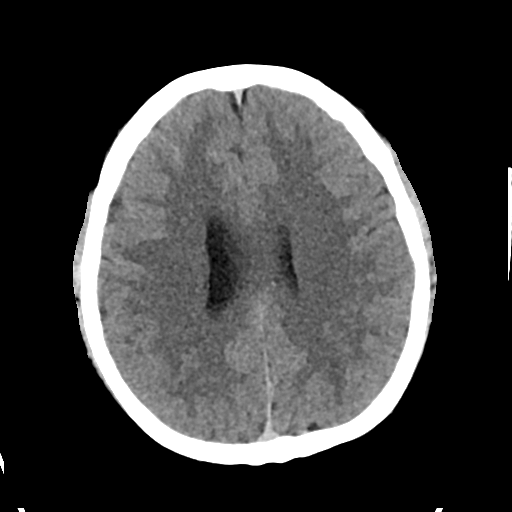
[im 21/34  bone]
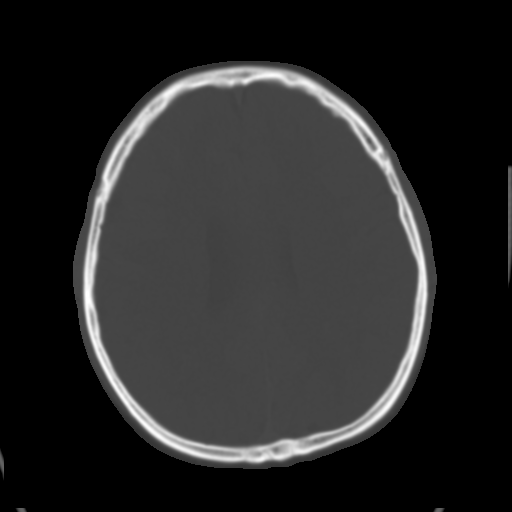
[im 25/34  brain]
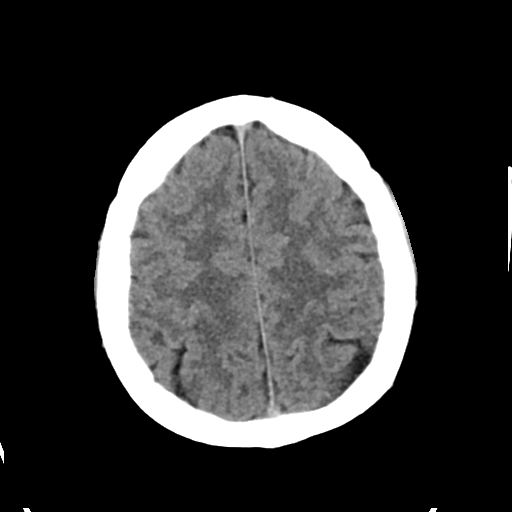
[im 29/34  brain]
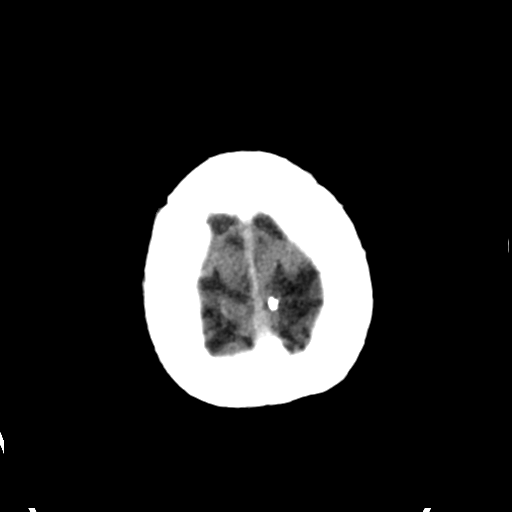

[Series 4: head bone · axial · 0.40mm/px · z∈[-90,-58]mm · 3 of 84 slices shown]
[im 9/84  bone]
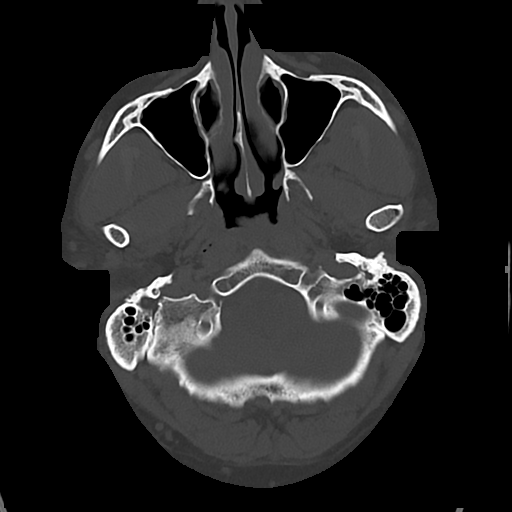
[im 17/84  bone]
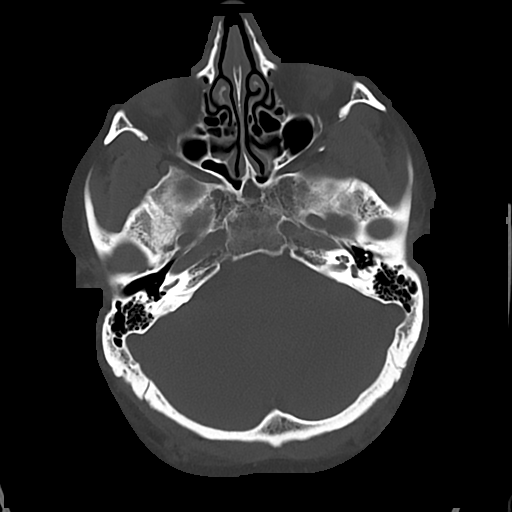
[im 25/84  bone]
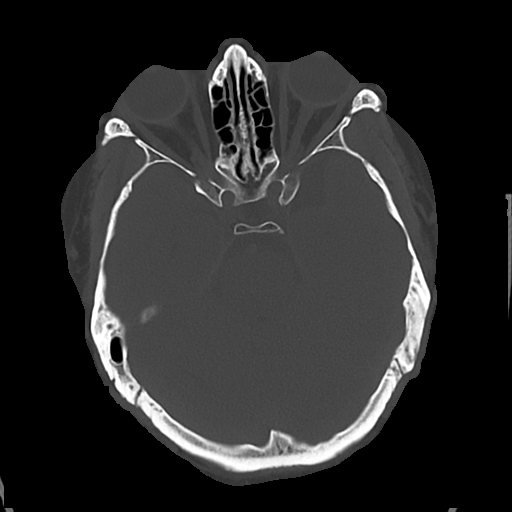

[Series 5: cor soft · coronal · 0.31mm/px · 3 of 71 slices shown]
[im 24/71  brain]
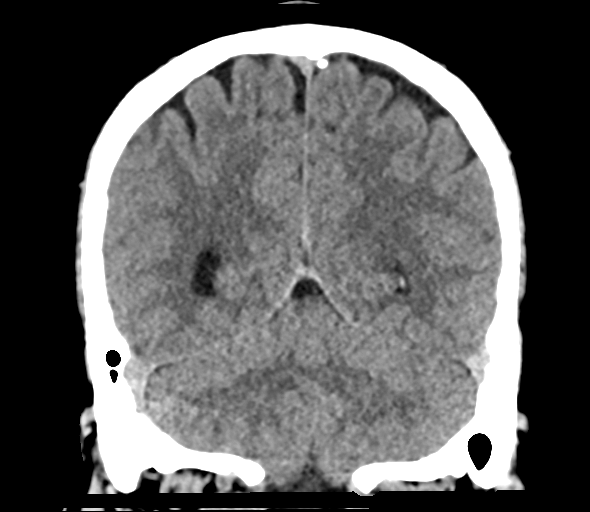
[im 32/71  brain]
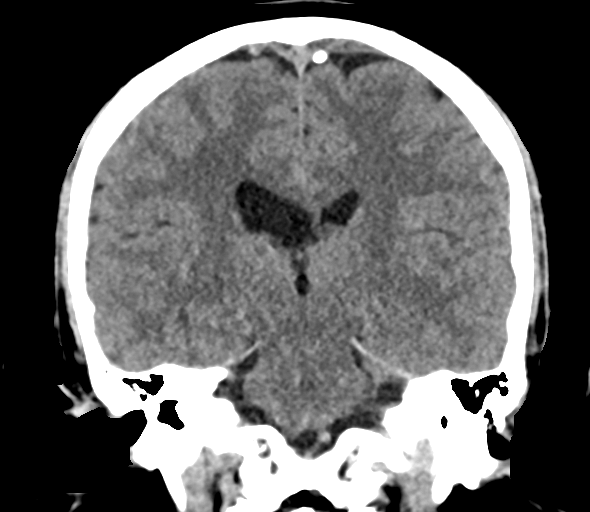
[im 39/71  brain]
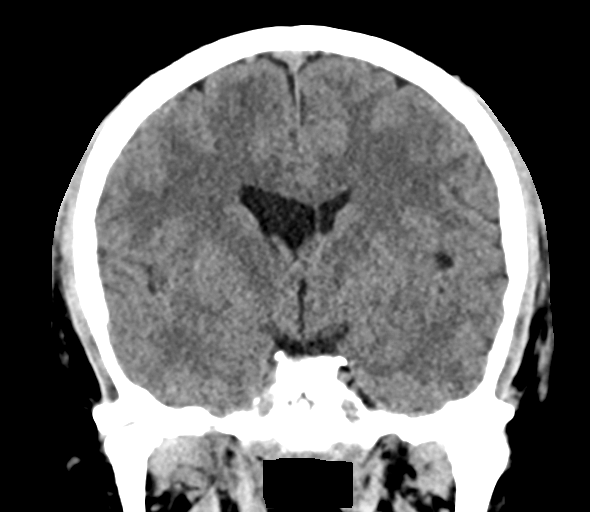

[Series 6: sag soft · sagittal · 0.31mm/px · 3 of 61 slices shown]
[im 21/61  brain]
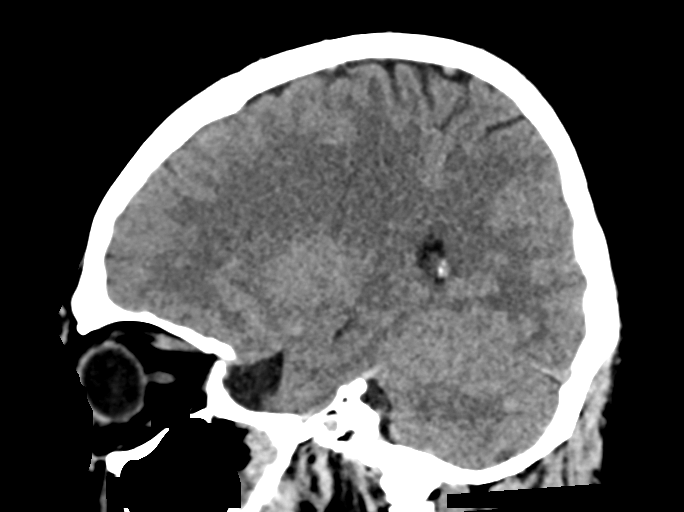
[im 31/61  brain]
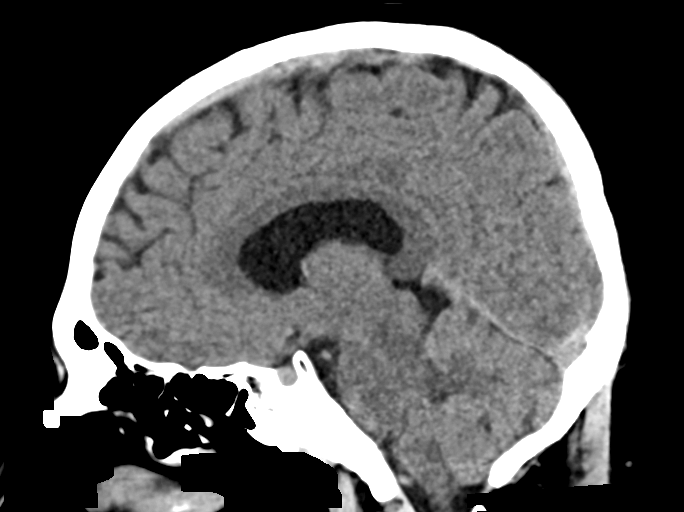
[im 41/61  brain]
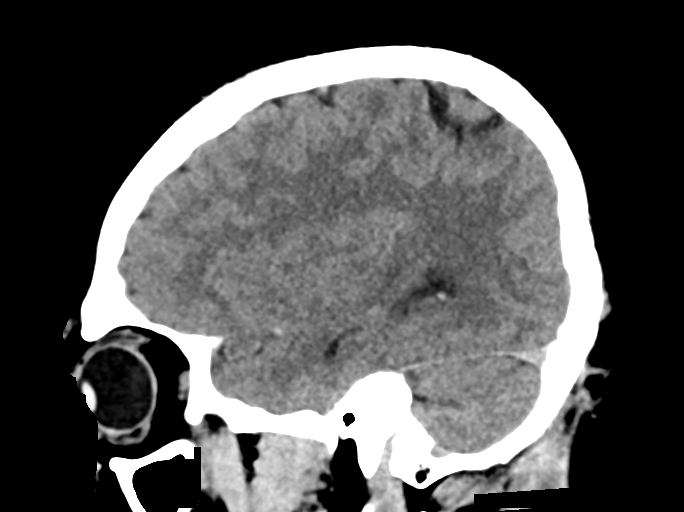

[16 of 47 positions shown; findings below may reference images not displayed]

FINDINGS: Brain: There is no mass, hemorrhage or extra-axial collection.
Asymmetry of the lateral ventricles with slightly enlarged right
side. This may indicate the presence of an intraventricular
arachnoid cyst. The brain parenchyma is normal, without acute or
chronic infarction. Small left middle cranial fossa arachnoid cyst.

Vascular: No abnormal hyperdensity of the major intracranial
arteries or dural venous sinuses. No intracranial atherosclerosis.

Skull: The visualized skull base, calvarium and extracranial soft
tissues are normal.

Sinuses/Orbits: No fluid levels or advanced mucosal thickening of
the visualized paranasal sinuses. No mastoid or middle ear effusion.
The orbits are normal.
